# Patient Record
Sex: Male | Born: 1990 | Race: Black or African American | Hispanic: No | Smoking: Never smoker
Health system: Southern US, Community
[De-identification: ages and names within clinical notes are randomized; demographics above are authoritative.]

---
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
## 2010-03-30 LAB — POCT I-STAT, CHEM 8
Creatinine, Ser: 1.3 mg/dL (ref 0.4–1.5)
Glucose, Bld: 85 mg/dL (ref 70–99)
Hemoglobin: 15.3 g/dL (ref 13.0–17.0)

## 2010-04-03 LAB — POCT I-STAT, CHEM 8
BUN: 18 mg/dL (ref 6–23)
Calcium, Ion: 1.18 mmol/L (ref 1.12–1.32)
Chloride: 106 mEq/L (ref 96–112)
Creatinine, Ser: 1 mg/dL (ref 0.4–1.5)
HCT: 44 % (ref 39.0–52.0)
Hemoglobin: 15 g/dL (ref 13.0–17.0)
Potassium: 4.2 mEq/L (ref 3.5–5.1)
TCO2: 28 mmol/L (ref 0–100)

## 2010-05-09 ENCOUNTER — Other Ambulatory Visit: Payer: Self-pay | Admitting: Internal Medicine

## 2010-05-09 DIAGNOSIS — K219 Gastro-esophageal reflux disease without esophagitis: Secondary | ICD-10-CM

## 2010-05-09 DIAGNOSIS — R1013 Epigastric pain: Secondary | ICD-10-CM

## 2010-05-11 ENCOUNTER — Ambulatory Visit
Admission: RE | Admit: 2010-05-11 | Discharge: 2010-05-11 | Disposition: A | Discharge status: Home / Self Care | Payer: 59 | Source: Ambulatory Visit | Attending: Internal Medicine | Admitting: Internal Medicine

## 2010-05-11 DIAGNOSIS — R1013 Epigastric pain: Secondary | ICD-10-CM

## 2010-05-11 DIAGNOSIS — K219 Gastro-esophageal reflux disease without esophagitis: Secondary | ICD-10-CM

## 2010-05-13 ENCOUNTER — Emergency Department (HOSPITAL_COMMUNITY): Payer: 59

## 2010-05-13 ENCOUNTER — Emergency Department (HOSPITAL_COMMUNITY)
Admission: EM | Admit: 2010-05-13 | Discharge: 2010-05-13 | Disposition: A | Discharge status: Home / Self Care | Payer: 59 | Attending: Emergency Medicine | Admitting: Emergency Medicine

## 2010-05-13 DIAGNOSIS — R109 Unspecified abdominal pain: Secondary | ICD-10-CM | POA: Insufficient documentation

## 2010-05-13 DIAGNOSIS — K219 Gastro-esophageal reflux disease without esophagitis: Secondary | ICD-10-CM | POA: Insufficient documentation

## 2010-05-13 DIAGNOSIS — R509 Fever, unspecified: Secondary | ICD-10-CM | POA: Insufficient documentation

## 2010-05-13 DIAGNOSIS — J3489 Other specified disorders of nose and nasal sinuses: Secondary | ICD-10-CM | POA: Insufficient documentation

## 2010-05-13 LAB — COMPREHENSIVE METABOLIC PANEL
ALT: 9 U/L (ref 0–53)
AST: 16 U/L (ref 0–37)
Alkaline Phosphatase: 73 U/L (ref 39–117)
Calcium: 9.4 mg/dL (ref 8.4–10.5)
Chloride: 99 mEq/L (ref 96–112)
Creatinine, Ser: 1.05 mg/dL (ref 0.4–1.5)
GFR calc Af Amer: >60 mL/min (ref >60)
GFR calc non Af Amer: >60 mL/min (ref >60)
Glucose, Bld: 81 mg/dL (ref 70–99)
Total Protein: 8.2 g/dL (ref 6.0–8.3)

## 2010-05-13 LAB — CBC
MCH: 28.3 pg (ref 26.0–34.0)
MCV: 82.8 fL (ref 78.0–100.0)
Platelets: 210 10*3/uL (ref 150–400)
RBC: 5.34 MIL/uL (ref 4.22–5.81)
WBC: 8 10*3/uL (ref 4.0–10.5)

## 2010-05-13 LAB — DIFFERENTIAL
Basophils Relative: 1 % (ref 0–1)
Monocytes Relative: 12 % (ref 3–12)
Neutrophils Relative %: 63 % (ref 43–77)

## 2011-05-27 ENCOUNTER — Ambulatory Visit (INDEPENDENT_AMBULATORY_CARE_PROVIDER_SITE_OTHER): Payer: 59 | Admitting: Family Medicine

## 2011-05-27 ENCOUNTER — Encounter: Payer: Self-pay | Admitting: Family Medicine

## 2011-05-27 VITALS — BP 116/72 | HR 75 | Ht 69.0 in | Wt 128.0 lb

## 2011-05-27 DIAGNOSIS — R079 Chest pain, unspecified: Secondary | ICD-10-CM

## 2011-05-27 DIAGNOSIS — R209 Unspecified disturbances of skin sensation: Secondary | ICD-10-CM

## 2011-05-27 DIAGNOSIS — R200 Anesthesia of skin: Secondary | ICD-10-CM

## 2011-05-27 DIAGNOSIS — Z8719 Personal history of other diseases of the digestive system: Secondary | ICD-10-CM

## 2011-05-27 NOTE — Patient Instructions (Signed)
If you have these symptoms again, be aware of what, when, where and why. Turn here for further evaluation at that point.

## 2011-05-27 NOTE — Progress Notes (Signed)
  Subjective:    Patient ID: Bruce Murray, male    DOB: [birth year], 20 y.o.   MRN: [id number]  HPI He is here for consult concerning 2 issues. The first is left axillary discomfort that occurred with no physical activity in fact he went out to play basketball after this and had no difficulties. He cannot associate this with breathing, movement, arm trouble. He is also had another episode of left hand numbness. He noted one incident while he was driving but he was using his right arm. Both the symptoms went away relatively quickly and are not related to each other. He was seen in the emergency room in the past and told that he had reflux disease. He has been on Nexium for this. Review of Systems     Objective:   Physical Exam  cardiac exam shows regular rhythm without murmurs or gallops. Lungs clear to auscultation. No chest wall tenderness. Axillary exam negative. Full motion of the shoulder with no laxity or pain. Normal strength.       Assessment & Plan:   1. History of gastroesophageal reflux (GERD)   2. Chest pain at rest   3. Left arm numbness    instructed him to pay attention to his symptoms in regard to what, when, where and why. I reassured him that I found nothing of significance.

## 2012-04-29 IMAGING — RF DG UGI W/ HIGH DENSITY W/KUB
19 of 24 series · 19 of 24 positions shown · non-contrast
Comparison: [HOSPITAL] chest x-ray 09/13/2009.

CLINICAL DATA: Epigastric abdominal pain, gastroesophageal reflux
disease, sore throat.

UPPER GI SERIES WITH KUB
TECHNIQUE: Routine upper GI series was performed with thin and
high density barium with ingested 13 mm barium tablet with water.
Pediatric technique.
Fluoroscopy Time: 2.5 minutes

[Series 1: run · 1 of 1 slices shown (1 of 19)]
[im 1/1]
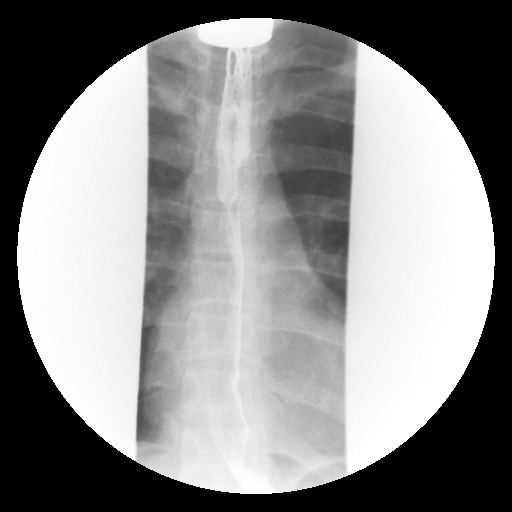

[Series 2: run · 1 of 1 slices shown (2 of 19)]
[im 1/1]
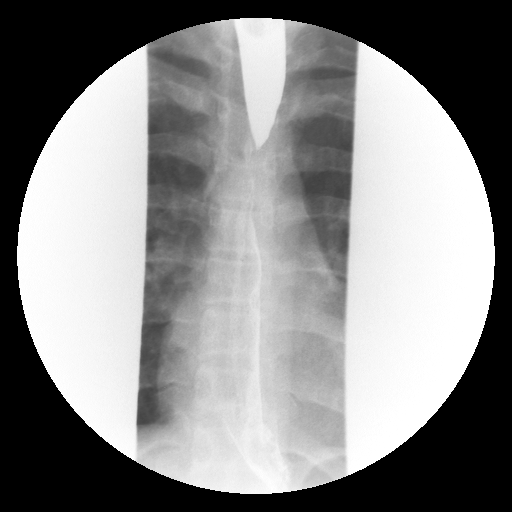

[Series 4: run · 1 of 1 slices shown (3 of 19)]
[im 1/1]
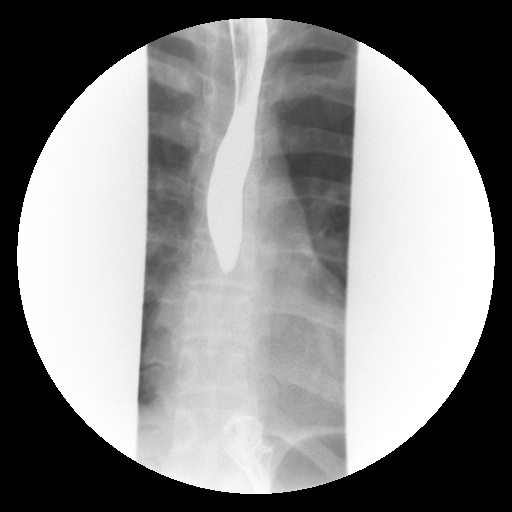

[Series 5: run · 1 of 1 slices shown (4 of 19)]
[im 1/1]
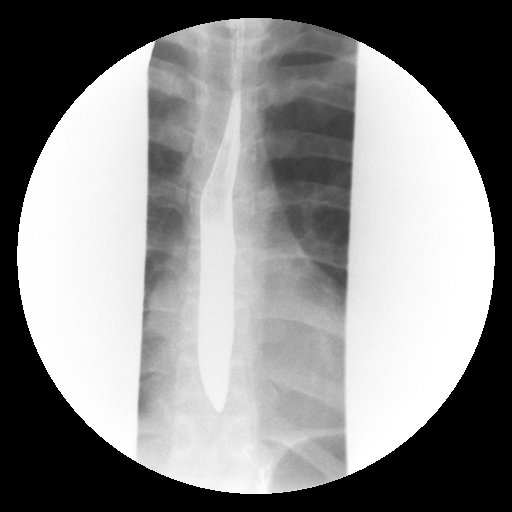

[Series 6: run · 1 of 1 slices shown (5 of 19)]
[im 1/1]
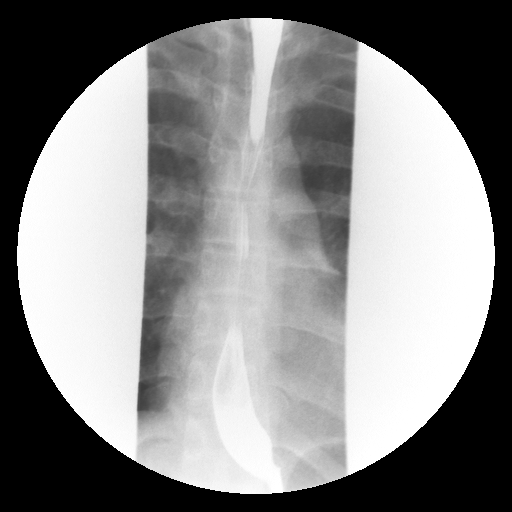

[Series 7: run · 1 of 1 slices shown (6 of 19)]
[im 1/1]
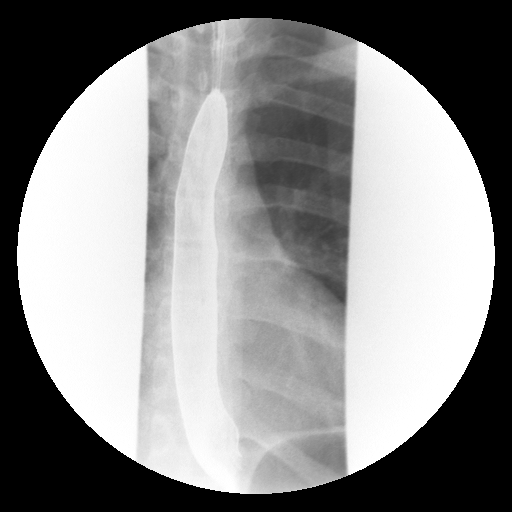

[Series 9: run · 1 of 1 slices shown (7 of 19)]
[im 1/1]
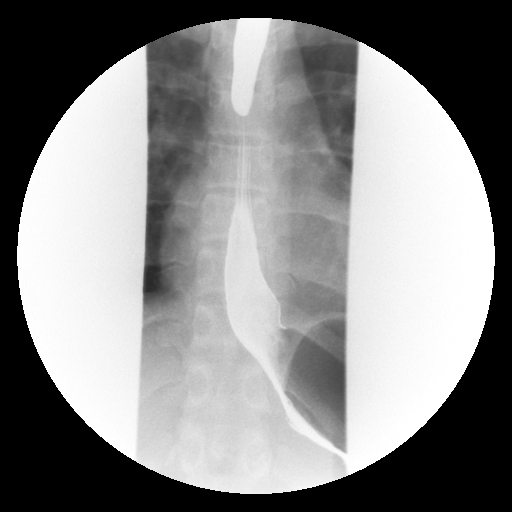

[Series 10: run · 1 of 1 slices shown (8 of 19)]
[im 1/1]
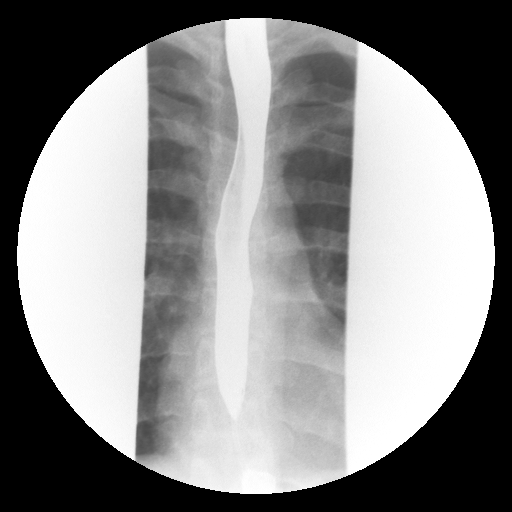

[Series 11: run · 1 of 1 slices shown (9 of 19)]
[im 1/1]
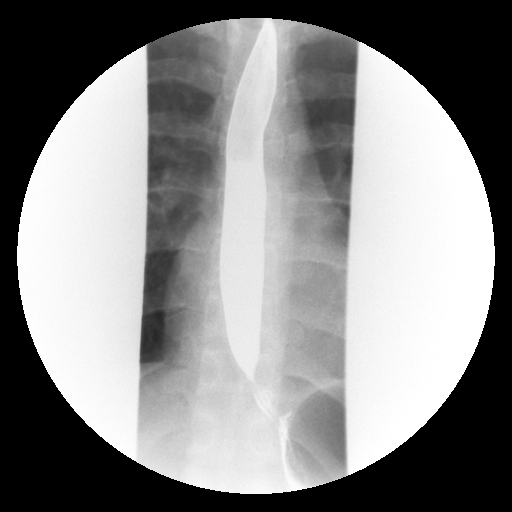

[Series 13: run · 1 of 1 slices shown (10 of 19)]
[im 1/1]
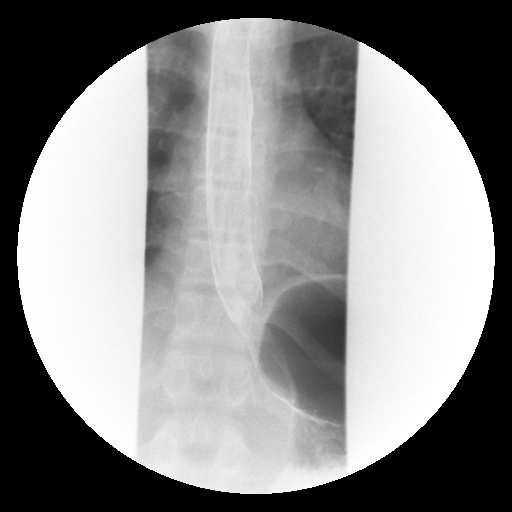

[Series 14: run · 1 of 5 slices shown (11 of 19)]
[im 1/5]
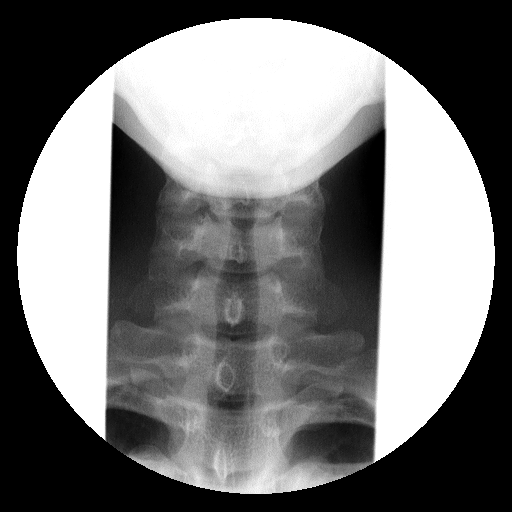

[Series 15: run · 1 of 1 slices shown (12 of 19)]
[im 1/1]
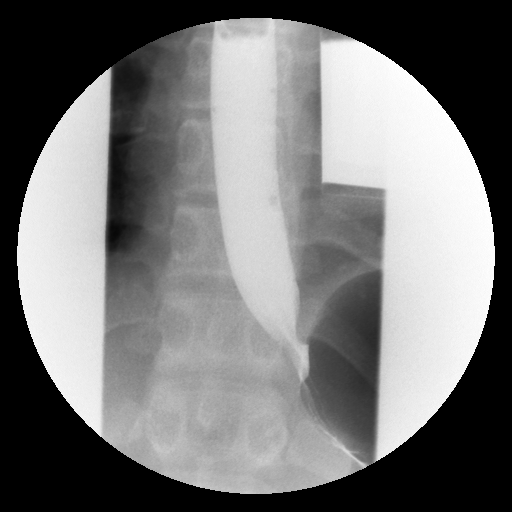

[Series 16: run · 1 of 1 slices shown (13 of 19)]
[im 1/1]
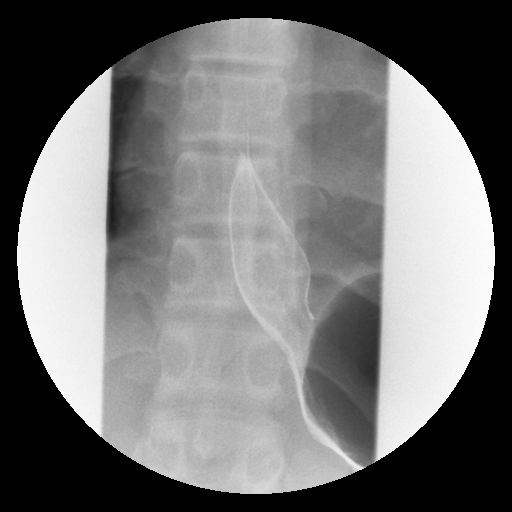

[Series 18: run · 1 of 1 slices shown (14 of 19)]
[im 1/1]
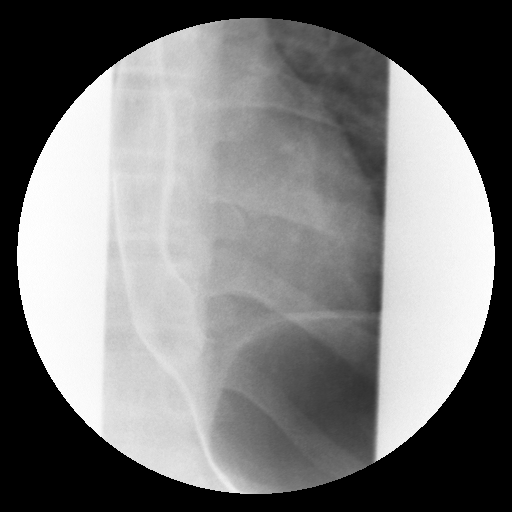

[Series 19: run · 1 of 11 slices shown (15 of 19)]
[im 1/11]
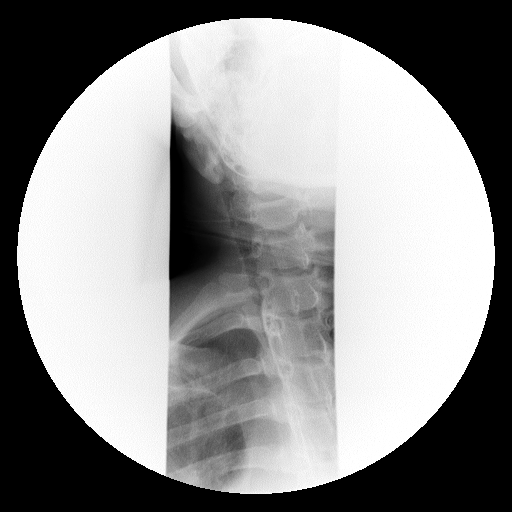

[Series 20: run · 1 of 17 slices shown (16 of 19)]
[im 1/17]
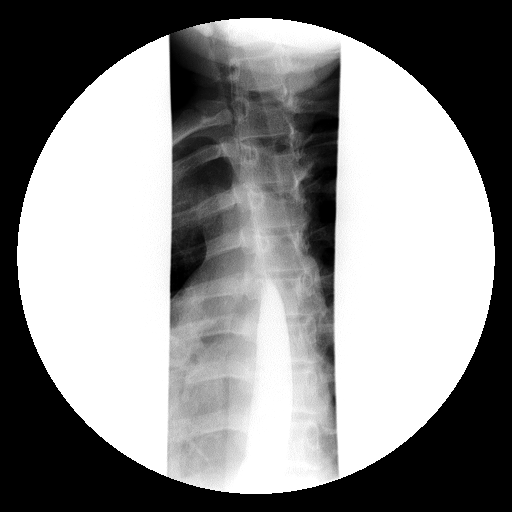

[Series 21: run · 1 of 3 slices shown (17 of 19)]
[im 1/3]
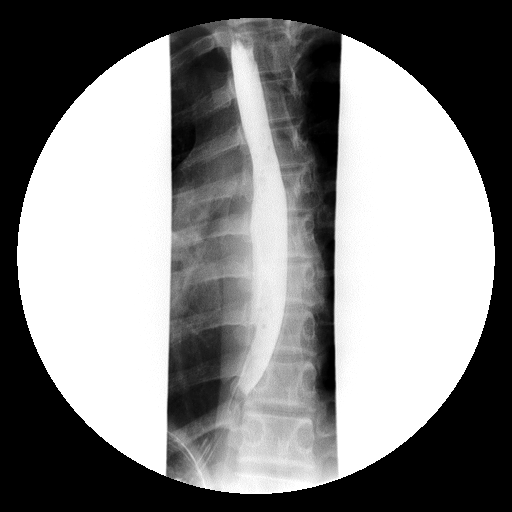

[Series 23: run · 1 of 1 slices shown (18 of 19)]
[im 1/1]
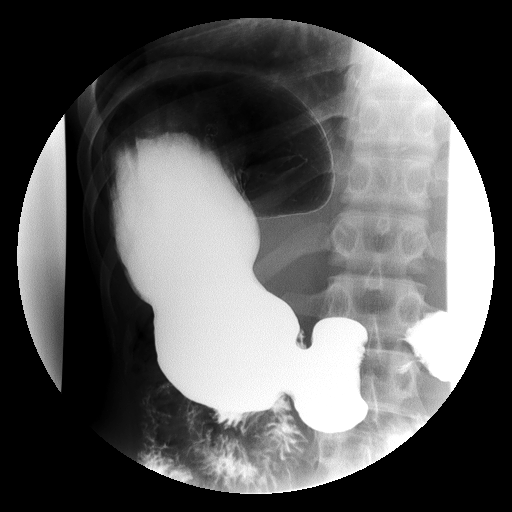

[Series 24: run · 1 of 1 slices shown (19 of 19)]
[im 1/1]
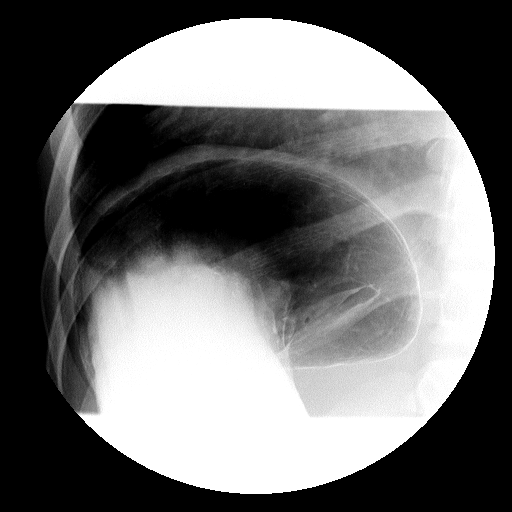

[19 of 24 positions shown; findings below may reference images not displayed]

FINDINGS: Normal antegrade peristalsis through cervical and
thoracic esophagus visualized.

Ingested 13 mm barium tablet readily passed into stomach.

No spontaneous with slight induced (Valsalva/water siphon)
gastroesophageal reflux demonstrated.

No other significant intrinsic or extrinsic esophageal lesions
visualized.

Stomach and jejunum appear normal.  Incidental tiny third portion
duodenal diverticulum visualized with duodenum otherwise normal-
appearing.  Prompt egress of barium through structures visualized.

Scout view demonstrates slight to moderate retained colonic feces
with normal bowel gas pattern visualized.
IMPRESSION: 1.  Slight induced gastroesophageal reflux.
2.  Incidental tiny third portion duodenal diverticulum.
3.  Otherwise, normal. (Slight to moderate retained colonic feces
with normal bowel gas pattern).

## 2012-05-01 IMAGING — CR DG ABDOMEN ACUTE W/ 1V CHEST
3 series · 3 of 3 positions shown · non-contrast
Comparison: Chest x-ray of 09/13/2009

CLINICAL DATA: Mid abdominal pain, constipation

ACUTE ABDOMEN SERIES (ABDOMEN 2 VIEW & CHEST 1 VIEW)

[w chest pa]
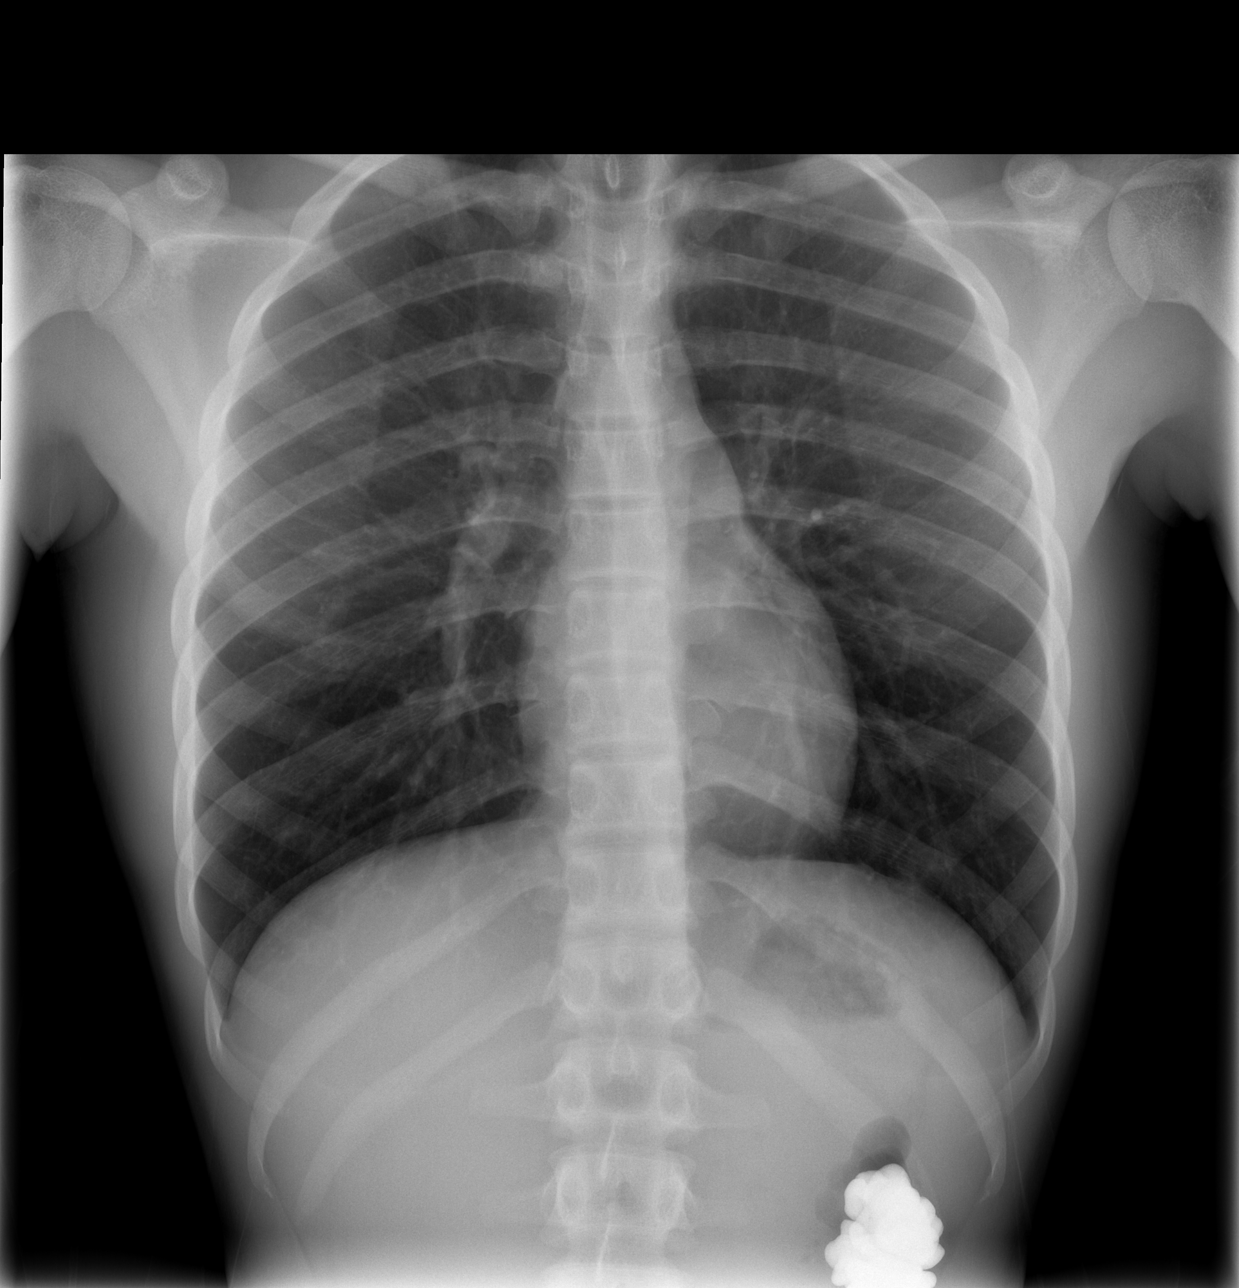

[w abdomen upright]
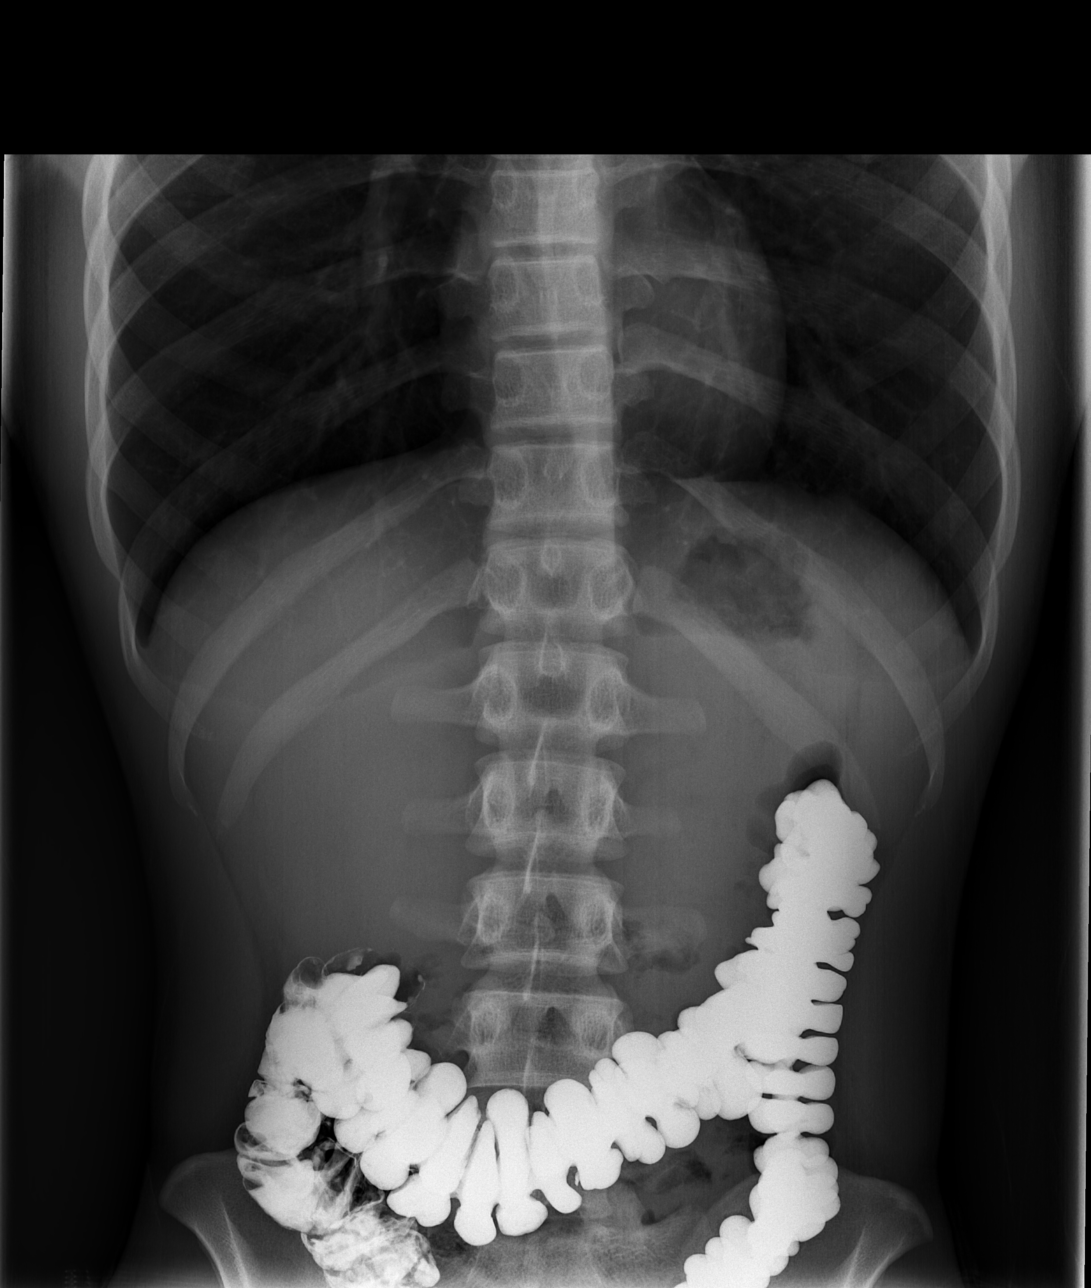

[t abdomen supine]
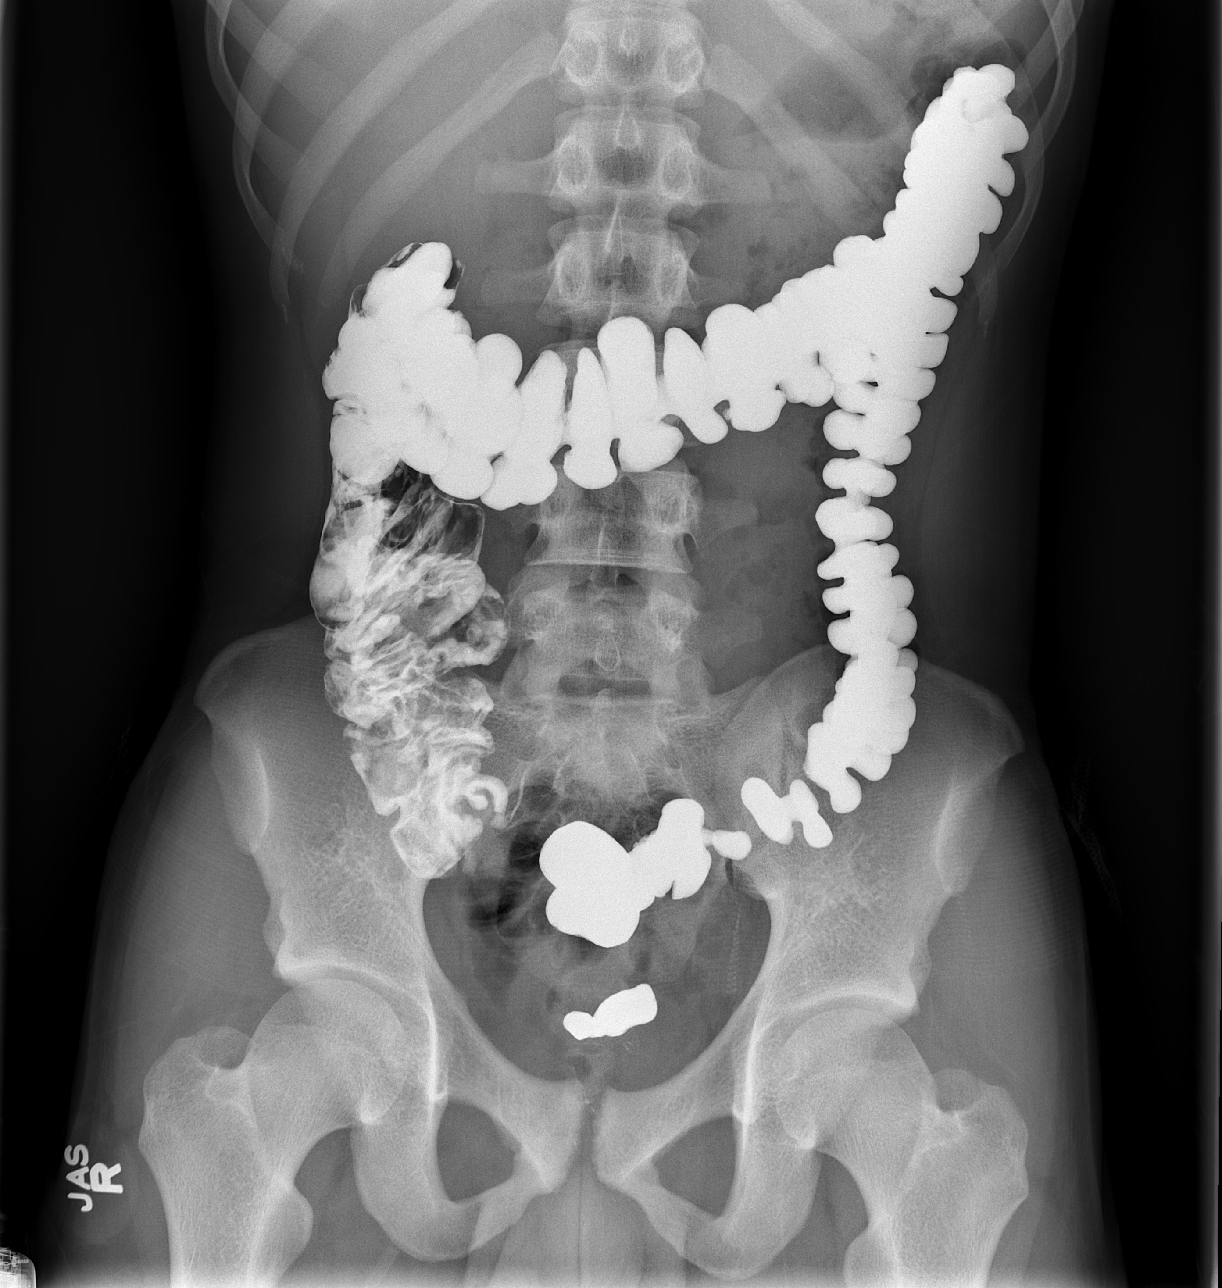

[3 of 3 positions shown; findings below may reference images not displayed]

FINDINGS: The lungs are clear.  Mediastinal contours appear normal.
The heart is within normal limits in size.

Supine and erect views of the abdomen show barium scattered
throughout the nondistended colon.  No small bowel distention is
seen.  No free air is noted.
IMPRESSION: 1.  No active lung disease.
2.  Barium throughout the nondistended colon.  No bowel
obstruction.

## 2018-04-17 ENCOUNTER — Encounter: Payer: Self-pay | Admitting: Emergency Medicine

## 2018-04-17 ENCOUNTER — Other Ambulatory Visit: Payer: Self-pay

## 2018-04-17 ENCOUNTER — Ambulatory Visit
Admission: EM | Admit: 2018-04-17 | Discharge: 2018-04-17 | Disposition: A | Discharge status: Home / Self Care | Payer: BLUE CROSS/BLUE SHIELD | Attending: Family Medicine | Admitting: Family Medicine

## 2018-04-17 DIAGNOSIS — Z23 Encounter for immunization: Secondary | ICD-10-CM | POA: Diagnosis not present

## 2018-04-17 DIAGNOSIS — W25XXXA Contact with sharp glass, initial encounter: Secondary | ICD-10-CM | POA: Diagnosis not present

## 2018-04-17 DIAGNOSIS — S91012A Laceration without foreign body, left ankle, initial encounter: Secondary | ICD-10-CM | POA: Diagnosis not present

## 2018-04-17 MED ORDER — TETANUS-DIPHTH-ACELL PERTUSSIS 5-2.5-18.5 LF-MCG/0.5 IM SUSP
0.5000 mL | Freq: Once | INTRAMUSCULAR | Status: AC
  Administered 2018-04-17: 14:00:00 0.5 mL via INTRAMUSCULAR

## 2018-04-17 NOTE — ED Notes (Signed)
Wound cleaned with soap and water, then rinsed with of sterile water.

## 2018-04-17 NOTE — Discharge Instructions (Signed)

## 2018-04-17 NOTE — ED Provider Notes (Signed)
[facility]    CSN: [id number] Arrival date & time: 04/17/18  1333     History   Chief Complaint Chief Complaint  Patient presents with  . Laceration    HPI Bruce Murray is a 28 y.o. male no significant medical history presenting today for evaluation of ankle laceration.  Patient states that this morning his mirror broke and the glass from this cut his left ankle.  States bleeding was controlled.  Tetanus was approximately 5 years ago.  Tried cleaning it out with peroxide.  Denies difficulty moving foot.  Denies numbness or tingling.  HPI  History reviewed. No pertinent past medical history.  There are no active problems to display for this patient.   History reviewed. No pertinent surgical history.     Home Medications    Prior to Admission medications   Medication Sig Start Date End Date Taking? Authorizing Provider  esomeprazole (NEXIUM) 40 MG capsule Take 40 mg by mouth daily before breakfast. Name brand only works    [provider]    Family History History reviewed. No pertinent family history.  Social History Social History   Tobacco Use  . Smoking status: Never Smoker  . Smokeless tobacco: Never Used  Substance Use Topics  . Alcohol use: Not Currently  . Drug use: Not Currently     Allergies   Patient has no known allergies.   Review of Systems Review of Systems  Constitutional: Negative for fatigue and fever.  Eyes: Negative for redness, itching and visual disturbance.  Respiratory: Negative for shortness of breath.   Cardiovascular: Negative for chest pain and leg swelling.  Gastrointestinal: Negative for nausea and vomiting.  Musculoskeletal: Negative for arthralgias and myalgias.  Skin: Positive for wound. Negative for color change and rash.  Neurological: Negative for dizziness, syncope, weakness, light-headedness and headaches.     Physical Exam Triage Vital Signs ED Triage Vitals [04/17/18 1342]  Enc Vitals  Group     BP (!) 119/58     Pulse Rate 61     Resp 18     Temp 97.9 F (36.6 C)     Temp Source Oral     SpO2 97 %     Weight      Height      Head Circumference      Peak Flow      Pain Score 0     Pain Loc      Pain Edu?      Excl. in GC?    No data found.  Updated Vital Signs BP (!) 119/58 (BP Location: Left Arm)   Pulse 61   Temp 97.9 F (36.6 C) (Oral)   Resp 18   SpO2 97%   Visual Acuity Right Eye Distance:   Left Eye Distance:   Bilateral Distance:    Right Eye Near:   Left Eye Near:    Bilateral Near:     Physical Exam Vitals signs and nursing note reviewed.  Constitutional:      Appearance: He is well-developed.     Comments: No acute distress  HENT:     Head: Normocephalic and atraumatic.     Nose: Nose normal.  Eyes:     Conjunctiva/sclera: Conjunctivae normal.  Neck:     Musculoskeletal: Neck supple.  Cardiovascular:     Rate and Rhythm: Normal rate.  Pulmonary:     Effort: Pulmonary effort is normal. No respiratory distress.  Abdominal:     General: There is  no distension.  Musculoskeletal: Normal range of motion.     Comments: Full active range of motion of foot, negative Thompson's Dorsalis pedis 2+, sensation intact distally  Skin:    General: Skin is warm and dry.     Comments: Left lateral ankle with approximately 1.5 cm laceration extending from Achilles towards lateral malleolus, well reapproximated with plantar flexion, but does gape approximately 3 to 4 mm with dorsiflexion  No visible underlying tendon   Neurological:     Mental Status: He is alert and oriented to person, place, and time.      UC Treatments / Results  Labs (all labs ordered are listed, but only abnormal results are displayed) Labs Reviewed - No data to display  EKG None  Radiology No results found.  Procedures Laceration Repair Date/Time: 04/17/2018 3:18 PM Performed by: , Junius Creamer, PA-C Authorized by: Eustace Moore, MD   Consent:     Consent obtained:  Verbal   Consent given by:  Patient   Risks discussed:  Infection, pain, tendon damage and poor cosmetic result   Alternatives discussed:  No treatment Anesthesia (see MAR for exact dosages):    Anesthesia method:  Local infiltration   Local anesthetic:  Lidocaine 2% w/o epi Laceration details:    Location:  Foot   Foot location:  L achilles   Length (cm):  1.5   Depth (mm):  3 Repair type:    Repair type:  Simple Pre-procedure details:    Preparation:  Patient was prepped and draped in usual sterile fashion Exploration:    Hemostasis achieved with:  Direct pressure   Wound exploration: wound explored through full range of motion     Wound extent: no foreign bodies/material noted, no muscle damage noted, no tendon damage noted and no underlying fracture noted     Contaminated: no   Treatment:    Area cleansed with:  Soap and water   Amount of cleaning:  Standard   Irrigation solution:  Sterile water   Irrigation volume:  200   Irrigation method:  Syringe   Visualized foreign bodies/material removed: no   Skin repair:    Repair method:  Sutures   Suture size:  5-0   Suture material:  Prolene   Suture technique:  Simple interrupted   Number of sutures:  3 Approximation:    Approximation:  Close Post-procedure details:    Dressing:  Non-adherent dressing   Patient tolerance of procedure:  Tolerated well, no immediate complications   (including critical care time)  Medications Ordered in UC Medications  Tdap (BOOSTRIX) injection 0.5 mL (0.5 mLs Intramuscular Given 04/17/18 1403)    Initial Impression / Assessment and Plan / UC Course  I have reviewed the triage vital signs and the nursing notes.  Pertinent labs & imaging results that were available during my care of the patient were reviewed by me and considered in my medical decision making (see chart for details).    Tetanus updated. Wound repaired given gaping with movement of foot, felt  sutures would hold better than Dermabond.  Does not appear to have any tendon involvement, negative Thompson.  Laceration repaired with 3 sutures, sutures to be removed in 7 to 10 days.  No immediate complications of repair.  Discussed wound care.  Continue to monitor,Discussed strict return precautions. Patient verbalized understanding and is agreeable with plan.  Final Clinical Impressions(s) / UC Diagnoses   Final diagnoses:  Laceration of left ankle, initial encounter  Discharge Instructions     WOUND CARE Please return in 7-10 days to have your stitches/staples removed or sooner if you have concerns. Marland Kitchen Keep area clean and dry for 24 hours. Do not remove bandage, if applied. . After 24 hours, remove bandage and wash wound gently with mild soap and warm water. Reapply a new bandage after cleaning wound, if directed. . Continue daily cleansing with soap and water until stitches/staples are removed. . Do not apply any ointments or creams to the wound while stitches/staples are in place, as this may cause delayed healing. . Notify the office if you experience any of the following signs of infection: Swelling, redness, pus drainage, streaking, fever >101.0 F . Notify the office if you experience excessive bleeding that does not stop after 15-20 minutes of constant, firm pressure.    ED Prescriptions    None     Controlled Substance Prescriptions [place] Controlled Substance Registry consulted? Not Applicable   Lew Dawes, [place] 04/17/18 1520

## 2018-04-17 NOTE — ED Notes (Signed)
Patient able to ambulate independently  

## 2018-04-17 NOTE — ED Triage Notes (Signed)
Pt cut external left ankle this morning with a broken mirror.  Pt states he cleaned it out with peroxide after it happened.  States tetanus update was greater than 5 years.  Bleeding controlled.

## 2018-04-24 ENCOUNTER — Other Ambulatory Visit: Payer: Self-pay

## 2018-04-24 ENCOUNTER — Ambulatory Visit
Admission: EM | Admit: 2018-04-24 | Discharge: 2018-04-24 | Disposition: A | Discharge status: Home / Self Care | Payer: BLUE CROSS/BLUE SHIELD

## 2018-04-24 DIAGNOSIS — Z4802 Encounter for removal of sutures: Secondary | ICD-10-CM | POA: Diagnosis not present

## 2018-04-24 NOTE — ED Notes (Signed)
Patient able to ambulate independently  

## 2018-04-24 NOTE — ED Triage Notes (Signed)
Pt presents for removal of sutures, 3 removed.

## 2019-09-04 ENCOUNTER — Ambulatory Visit
Admission: RE | Admit: 2019-09-04 | Discharge: 2019-09-04 | Disposition: A | Discharge status: Home / Self Care | Payer: 59 | Source: Ambulatory Visit | Attending: Physician Assistant | Admitting: Physician Assistant

## 2019-09-04 ENCOUNTER — Other Ambulatory Visit: Payer: Self-pay

## 2019-09-04 VITALS — BP 126/83 | HR 74 | Temp 98.4°F | Resp 16

## 2019-09-04 DIAGNOSIS — R1012 Left upper quadrant pain: Secondary | ICD-10-CM

## 2019-09-04 DIAGNOSIS — R079 Chest pain, unspecified: Secondary | ICD-10-CM

## 2019-09-04 MED ORDER — DICYCLOMINE HCL 20 MG PO TABS: 20.0000 mg | ORAL_TABLET | Freq: Two times a day (BID) | ORAL | 0 refills | Status: AC

## 2019-09-04 NOTE — ED Provider Notes (Signed)
[facility]    CSN: [id number] Arrival date & time: 09/04/19  1156      History   Chief Complaint Chief Complaint  Patient presents with  . Chest Pain    Appt  . Abdominal Pain    HPI Bruce Murray is a 29 y.o. male.   29 year old male comes in for 1 month history of left chest/abdominal pain. Was at first intermittent chronically, now consistent for 1 month. Aching pain, worse with food, increased emotions. Some relief of symptoms with water, calming down. Also notes intermittent diarrhea that has resolved in the past 1-2 weeks, which did relieve some of the left chest discomfort. Denies nausea, vomiting. Denies melena, hematochezia. Denies cough, shortness of breath, dizziness. No fevers. prilosec 14 days without significant relief.      History reviewed. No pertinent past medical history.  There are no problems to display for this patient.   Past Surgical History:  Procedure Laterality Date  . ANTERIOR CRUCIATE LIGAMENT REPAIR Left 2014       Home Medications    Prior to Admission medications   Medication Sig Start Date End Date Taking? Authorizing Provider  dicyclomine (BENTYL) 20 MG tablet Take 1 tablet (20 mg total) by mouth 2 (two) times daily. 09/04/19   Cathie Hoops, Jakaree Pickard V, PA-C  esomeprazole (NEXIUM) 40 MG capsule Take 40 mg by mouth daily before breakfast. Name brand only works    [provider]    Family History Family History  Problem Relation Age of Onset  . Healthy Mother   . Healthy Father     Social History Social History   Tobacco Use  . Smoking status: Never Smoker  . Smokeless tobacco: Never Used  Substance Use Topics  . Alcohol use: Not Currently  . Drug use: Not Currently     Allergies   Patient has no known allergies.   Review of Systems Review of Systems  Reason unable to perform ROS: See HPI as above.     Physical Exam Triage Vital Signs ED Triage Vitals  Enc Vitals Group     BP 09/04/19 1235 126/83      Pulse Rate 09/04/19 1235 74     Resp 09/04/19 1235 16     Temp 09/04/19 1235 98.4 F (36.9 C)     Temp Source 09/04/19 1235 Oral     SpO2 09/04/19 1235 98 %     Weight --      Height --      Head Circumference --      Peak Flow --      Pain Score 09/04/19 1304 4     Pain Loc --      Pain Edu? --      Excl. in GC? --    No data found.  Updated Vital Signs BP 126/83 (BP Location: Left Arm)   Pulse 74   Temp 98.4 F (36.9 C) (Oral)   Resp 16   SpO2 98%   Physical Exam Constitutional:      General: He is not in acute distress.    Appearance: Normal appearance. He is well-developed. He is not toxic-appearing or diaphoretic.  HENT:     Head: Normocephalic and atraumatic.  Eyes:     Conjunctiva/sclera: Conjunctivae normal.     Pupils: Pupils are equal, round, and reactive to light.  Cardiovascular:     Rate and Rhythm: Normal rate and regular rhythm.     Heart sounds: No murmur heard.  No friction rub. No gallop.   Pulmonary:     Effort: Pulmonary effort is normal. No respiratory distress.     Comments: LCTAB Chest:     Chest wall: No tenderness.  Abdominal:     General: Bowel sounds are increased.     Palpations: Abdomen is soft.     Tenderness: There is no abdominal tenderness. There is no guarding or rebound.  Musculoskeletal:     Cervical back: Normal range of motion and neck supple.  Skin:    General: Skin is warm and dry.  Neurological:     Mental Status: He is alert and oriented to person, place, and time.      UC Treatments / Results  Labs (all labs ordered are listed, but only abnormal results are displayed) Labs Reviewed - No data to display  EKG   Radiology No results found.  Procedures Procedures (including critical care time)  Medications Ordered in UC Medications - No data to display  Initial Impression / Assessment and Plan / UC Course  I have reviewed the triage vital signs and the nursing notes.  Pertinent labs & imaging results  that were available during my care of the patient were reviewed by me and considered in my medical decision making (see chart for details).    Chronic left chest/LUQ aching that seems more related with oral intake. No relief with PPI. Increased BS with frequent small stools at times, will trial bentyl. Otherwise, to follow up with PCP for further work up. Return precautions given.  Final Clinical Impressions(s) / UC Diagnoses   Final diagnoses:  Left-sided chest pain  LUQ abdominal pain    ED Prescriptions    Medication Sig Dispense Auth. Provider   dicyclomine (BENTYL) 20 MG tablet Take 1 tablet (20 mg total) by mouth 2 (two) times daily. 20 tablet Belinda Fisher, PA-C     PDMP not reviewed this encounter.   Belinda Fisher, PA-C 09/04/19 1409

## 2019-09-04 NOTE — Discharge Instructions (Signed)
No alarming signs on exam. We can try Bentyl for possible IBS to see if this helps symptoms. Keep hydrated, you urine should be clear to pale yellow in color. Monitor for any worsening of symptoms, nausea or vomiting not controlled by medication, worsening abdominal pain, fever, go to the emergency department for further evaluation needed. Otherwise follow up with PCP for further evaluation needed.

## 2019-09-04 NOTE — ED Triage Notes (Addendum)
Patient presents for evaluation of chest and abdominal pain that he states has been ongoing for over a month. He reports a history of reflux and taking prilosec OTC because he felt it was not helping. He denies shob, dizziness and no n/v/d.

## 2019-12-27 ENCOUNTER — Inpatient Hospital Stay (HOSPITAL_COMMUNITY): Admission: RE | Admit: 2019-12-27 | Payer: Self-pay | Source: Ambulatory Visit

## 2020-07-06 ENCOUNTER — Encounter: Payer: Self-pay | Admitting: Family Medicine

## 2020-09-07 ENCOUNTER — Other Ambulatory Visit: Payer: Self-pay

## 2020-09-07 ENCOUNTER — Ambulatory Visit (HOSPITAL_COMMUNITY)
Admission: EM | Admit: 2020-09-07 | Discharge: 2020-09-07 | Disposition: A | Discharge status: Home / Self Care | Payer: 59 | Attending: Internal Medicine | Admitting: Internal Medicine

## 2020-09-07 NOTE — ED Notes (Signed)
Bruce Murray, patient access, reports patient has left going to emergency department

## 2021-09-13 ENCOUNTER — Emergency Department (HOSPITAL_BASED_OUTPATIENT_CLINIC_OR_DEPARTMENT_OTHER): Payer: Commercial Managed Care - HMO

## 2021-09-13 ENCOUNTER — Other Ambulatory Visit: Payer: Self-pay

## 2021-09-13 ENCOUNTER — Emergency Department (HOSPITAL_BASED_OUTPATIENT_CLINIC_OR_DEPARTMENT_OTHER)
Admission: EM | Admit: 2021-09-13 | Discharge: 2021-09-13 | Disposition: A | Discharge status: Home / Self Care | Payer: Commercial Managed Care - HMO | Attending: Emergency Medicine | Admitting: Emergency Medicine

## 2021-09-13 ENCOUNTER — Encounter (HOSPITAL_BASED_OUTPATIENT_CLINIC_OR_DEPARTMENT_OTHER): Payer: Self-pay

## 2021-09-13 DIAGNOSIS — R079 Chest pain, unspecified: Secondary | ICD-10-CM | POA: Insufficient documentation

## 2021-09-13 NOTE — ED Provider Notes (Signed)
[facility] EMERGENCY DEPT Provider Note   CSN: [id number] Arrival date & time: 09/13/21  1202     History  Chief Complaint  Patient presents with   Chest Pain    Bruce Murray is a 31 y.o. male.  The history is provided by the patient.  Chest Pain Pain location:  L chest Pain quality: aching   Pain radiates to:  Does not radiate Pain severity:  Mild Onset quality:  Gradual Duration:  1 week Timing:  Intermittent Chronicity:  New Context: raising an arm   Relieved by:  Nothing Worsened by:  Nothing Associated symptoms: no abdominal pain, no AICD problem, no altered mental status, no anorexia, no anxiety, no back pain, no claudication, no cough, no diaphoresis, no dizziness, no dysphagia, no fatigue, no fever, no headache, no heartburn, no lower extremity edema, no nausea, no near-syncope, no numbness, no orthopnea, no palpitations, no PND, no shortness of breath, no syncope, no vomiting and no weakness   Risk factors: no coronary artery disease, no diabetes mellitus, no high cholesterol, no hypertension, no prior DVT/PE and no smoking        Home Medications Prior to Admission medications   Medication Sig Start Date End Date Taking? Authorizing Provider  dicyclomine (BENTYL) 20 MG tablet Take 1 tablet (20 mg total) by mouth 2 (two) times daily. 09/04/19   Cathie Hoops, Amy V, PA-C  esomeprazole (NEXIUM) 40 MG capsule Take 40 mg by mouth daily before breakfast. Name brand only works    [provider]      Allergies    Patient has no known allergies.    Review of Systems   Review of Systems  Constitutional:  Negative for diaphoresis, fatigue and fever.  HENT:  Negative for trouble swallowing.   Respiratory:  Negative for cough and shortness of breath.   Cardiovascular:  Positive for chest pain. Negative for palpitations, orthopnea, claudication, syncope, PND and near-syncope.  Gastrointestinal:  Negative for abdominal pain, anorexia, heartburn, nausea and  vomiting.  Musculoskeletal:  Negative for back pain.  Neurological:  Negative for dizziness, weakness, numbness and headaches.    Physical Exam Updated Vital Signs BP 120/83 (BP Location: Right Arm)   Pulse 66   Temp 98.6 F (37 C)   Resp 16   Ht 5\' 9"  (1.753 m)   Wt 58.1 kg   SpO2 100%   BMI 18.92 kg/m  Physical Exam Vitals and nursing note reviewed.  Constitutional:      General: He is not in acute distress.    Appearance: He is well-developed. He is not ill-appearing.  HENT:     Head: Normocephalic and atraumatic.  Eyes:     Extraocular Movements: Extraocular movements intact.     Conjunctiva/sclera: Conjunctivae normal.     Pupils: Pupils are equal, round, and reactive to light.  Cardiovascular:     Rate and Rhythm: Normal rate and regular rhythm.     Pulses:          Radial pulses are 2+ on the right side and 2+ on the left side.     Heart sounds: Normal heart sounds. No murmur heard. Pulmonary:     Effort: Pulmonary effort is normal. No respiratory distress.     Breath sounds: Normal breath sounds. No decreased breath sounds or wheezing.  Abdominal:     Palpations: Abdomen is soft.     Tenderness: There is no abdominal tenderness.  Musculoskeletal:        General: No swelling.  Cervical back: Normal range of motion and neck supple.  Skin:    General: Skin is warm and dry.     Capillary Refill: Capillary refill takes less than 2 seconds.  Neurological:     General: No focal deficit present.     Mental Status: He is alert.  Psychiatric:        Mood and Affect: Mood normal.     ED Results / Procedures / Treatments   Labs (all labs ordered are listed, but only abnormal results are displayed) Labs Reviewed - No data to display  EKG EKG Interpretation  Date/Time:  Thursday September 13 2021 12:09:39 EDT Ventricular Rate:  75 PR Interval:  160 QRS Duration: 94 QT Interval:  340 QTC Calculation: 379 R Axis:   94 Text Interpretation: Normal sinus  rhythm with sinus arrhythmia When compared with ECG of 21-Oct-2009 10:43, Vent. rate has increased BY  27 BPM ST no longer elevated in Anterior leads Nonspecific T wave abnormality now evident in Inferior leads Nonspecific T wave abnormality now evident in Lateral leads Confirmed by Virgina Norfolk ([id number]) on 09/13/2021 12:15:36 PM  Radiology DG Chest Portable 1 View  Result Date: 09/13/2021 CLINICAL DATA:  Shortness of breath chest pain EXAM: PORTABLE CHEST 1 VIEW COMPARISON:  01/31/2017 FINDINGS: The heart size and mediastinal contours are within normal limits. Both lungs are clear. The visualized skeletal structures are unremarkable. IMPRESSION: No active disease. Electronically Signed   By: Judie Petit.  Shick M.D.   On: 09/13/2021 12:30    Procedures Procedures    Medications Ordered in ED Medications - No data to display  ED Course/ Medical Decision Making/ A&P                           Medical Decision Making Amount and/or Complexity of Data Reviewed Radiology: ordered.   Bryndan Bilyk is here with left-sided chest pain.  Normal vitals.  No fever.  Heart score 0, PERC negative.  I have no concern for ACS or PE.  EKG per my review and interpretation shows sinus rhythm.  No ischemic changes.  Chest x-ray ordered per my review and interpretation shows no evidence of pneumonia pneumothorax.  Suspect that this is muscular in nature.  Overall recommend Tylenol and ibuprofen and rest.  Discharged in good condition.  Understands return precautions.  This chart was dictated using voice recognition software.  Despite best efforts to proofread,  errors can occur which can change the documentation meaning.         Final Clinical Impression(s) / ED Diagnoses Final diagnoses:  Chest pain, unspecified type    Rx / DC Orders ED Discharge Orders     None         Virgina Norfolk, DO 09/13/21 1311

## 2021-09-13 NOTE — ED Triage Notes (Signed)
Patient here POV from Home.  Endorses CP that began this Week and is associated with SOB. Pain is Left lateral Chest in Axillary Region.  History of GERD. No N/V/D. No Fevers.  NAD Noted during Triage. A&Ox4. GCS 15 Ambulatory.

## 2021-09-13 NOTE — Discharge Instructions (Signed)
Chest x-ray with no evidence of pneumonia or any acute process.  Overall suspect that this is a muscular process.  Recommend 1000 mg of Tylenol every 6 hours as needed for pain.  Recommend 800 mg ibuprofen every 8 hours as needed for pain.  Follow-up with your primary care doctor.

## 2021-09-19 ENCOUNTER — Encounter: Payer: Self-pay | Admitting: Internal Medicine

## 2021-10-23 ENCOUNTER — Encounter: Payer: Self-pay | Admitting: Internal Medicine

## 2021-11-05 ENCOUNTER — Encounter: Payer: Self-pay | Admitting: Internal Medicine

## 2022-04-01 ENCOUNTER — Encounter (HOSPITAL_BASED_OUTPATIENT_CLINIC_OR_DEPARTMENT_OTHER): Payer: Self-pay

## 2022-04-01 ENCOUNTER — Emergency Department (HOSPITAL_BASED_OUTPATIENT_CLINIC_OR_DEPARTMENT_OTHER)
Admission: EM | Admit: 2022-04-01 | Discharge: 2022-04-01 | Disposition: A | Discharge status: Home / Self Care | Payer: BLUE CROSS/BLUE SHIELD | Attending: Emergency Medicine | Admitting: Emergency Medicine

## 2022-04-01 ENCOUNTER — Other Ambulatory Visit: Payer: Self-pay

## 2022-04-01 ENCOUNTER — Emergency Department (HOSPITAL_BASED_OUTPATIENT_CLINIC_OR_DEPARTMENT_OTHER): Payer: BLUE CROSS/BLUE SHIELD | Admitting: Radiology

## 2022-04-01 DIAGNOSIS — M79602 Pain in left arm: Secondary | ICD-10-CM | POA: Diagnosis not present

## 2022-04-01 DIAGNOSIS — M542 Cervicalgia: Secondary | ICD-10-CM | POA: Diagnosis not present

## 2022-04-01 DIAGNOSIS — M79601 Pain in right arm: Secondary | ICD-10-CM | POA: Insufficient documentation

## 2022-04-01 DIAGNOSIS — R079 Chest pain, unspecified: Secondary | ICD-10-CM | POA: Diagnosis not present

## 2022-04-01 LAB — TROPONIN I (HIGH SENSITIVITY): Troponin I (High Sensitivity): <2 ng/L (ref <18)

## 2022-04-01 LAB — BASIC METABOLIC PANEL
Anion gap: 7 (ref 5–15)
BUN: 16 mg/dL (ref 6–20)
CO2: 29 mmol/L (ref 22–32)
Calcium: 9.9 mg/dL (ref 8.9–10.3)
Chloride: 100 mmol/L (ref 98–111)
Creatinine, Ser: 1.09 mg/dL (ref 0.61–1.24)
GFR, Estimated: >60 mL/min (ref >60)
Glucose, Bld: 90 mg/dL (ref 70–99)
Potassium: 4.2 mmol/L (ref 3.5–5.1)
Sodium: 136 mmol/L (ref 135–145)

## 2022-04-01 LAB — CBC
HCT: 47.7 % (ref 39.0–52.0)
Hemoglobin: 15.9 g/dL (ref 13.0–17.0)
MCH: 28.2 pg (ref 26.0–34.0)
MCHC: 33.3 g/dL (ref 30.0–36.0)
MCV: 84.6 fL (ref 80.0–100.0)
Platelets: 243 10*3/uL (ref 150–400)
RBC: 5.64 MIL/uL (ref 4.22–5.81)
RDW: 13.2 % (ref 11.5–15.5)
WBC: 6.1 10*3/uL (ref 4.0–10.5)
nRBC: 0 % (ref 0.0–0.2)

## 2022-04-01 NOTE — ED Notes (Signed)
No repeat troponin needed at this time, Per Dr. Melina Copa

## 2022-04-01 NOTE — ED Triage Notes (Signed)
Patient here POV from Home.  Endorses Left Lateral Rib Cage pain that began 2 nights ago with some Left Arm Numbness/Tingling.   No Known Trauma or Injury. No SOB at rest. Some SOB with Exertion.   NAD noted during Triage. A&Ox4, GCS 15. Ambulatory.

## 2022-04-01 NOTE — Discharge Instructions (Signed)
You are seen in the emergency department for arm chest neck pain.  You had blood work chest x-ray EKG that did not show any significant abnormalities.  This is likely muscular and you should continue ibuprofen 3 times a day increase your fluid intake.  Follow-up with your primary care doctor.  Return to the emergency department if any worsening or concerning symptoms.

## 2022-04-01 NOTE — ED Notes (Signed)
Patient given discharge instructions. Questions were answered. Patient verbalized understanding of discharge instructions and care at home.  

## 2022-04-01 NOTE — ED Provider Notes (Signed)
[facility] Provider Note   CSN: [id number] Arrival date & time: 04/01/22  1223     History {Add pertinent medical, surgical, social history, OB history to HPI:1} Chief Complaint  Patient presents with   Arm Pain    Bruce Murray is a 32 y.o. male.  He has no significant past medical history.  He is complaining of some discomfort in his upper arms neck and left lateral chest that is been going on for a few days.  Bruce Murray he worked out last Wednesday and feels a little sore afterwards but it is never lasted this long.  Feels a little short of breath at times with heavy exertion.  Thinks he was feverish.  No cough no abdominal pain vomiting diarrhea  The history is provided by the patient.  Arm Pain This is a new problem. The current episode started more than 2 days ago. The problem occurs constantly. The problem has not changed since onset.Associated symptoms include chest pain. Pertinent negatives include no abdominal pain, no headaches and no shortness of breath. The symptoms are aggravated by bending and twisting. Nothing relieves the symptoms. He has tried rest for the symptoms. The treatment provided no relief.       Home Medications Prior to Admission medications   Medication Sig Start Date End Date Taking? Authorizing Provider  dicyclomine (BENTYL) 20 MG tablet Take 1 tablet (20 mg total) by mouth 2 (two) times daily. 09/04/19   Tasia Catchings, Amy V, PA-C  esomeprazole (NEXIUM) 40 MG capsule Take 40 mg by mouth daily before breakfast. Name brand only works    [provider]      Allergies    Patient has no known allergies.    Review of Systems   Review of Systems  Constitutional:  Positive for fever.  Eyes:  Negative for visual disturbance.  Respiratory:  Negative for shortness of breath.   Cardiovascular:  Positive for chest pain.  Gastrointestinal:  Negative for abdominal pain.  Musculoskeletal:  Positive for neck pain.  Skin:   Negative for rash.  Neurological:  Negative for weakness, numbness and headaches.    Physical Exam Updated Vital Signs BP 127/77 (BP Location: Right Arm)   Pulse 66   Temp (!) 97.1 F (36.2 C) (Temporal)   Resp 18   Ht 5\' 9"  (1.753 m)   Wt 68.5 kg   SpO2 100%   BMI 22.30 kg/m  Physical Exam Vitals and nursing note reviewed.  Constitutional:      General: He is not in acute distress.    Appearance: Normal appearance. He is well-developed.  HENT:     Head: Normocephalic and atraumatic.  Eyes:     Conjunctiva/sclera: Conjunctivae normal.  Cardiovascular:     Rate and Rhythm: Normal rate and regular rhythm.     Heart sounds: No murmur heard. Pulmonary:     Effort: Pulmonary effort is normal. No respiratory distress.     Breath sounds: Normal breath sounds.  Abdominal:     Palpations: Abdomen is soft.     Tenderness: There is no abdominal tenderness.  Musculoskeletal:        General: No tenderness, deformity or signs of injury. Normal range of motion.     Cervical back: Neck supple.  Skin:    General: Skin is warm and dry.     Capillary Refill: Capillary refill takes less than 2 seconds.  Neurological:     General: No focal deficit present.  Mental Status: He is alert.     Sensory: No sensory deficit.     Motor: No weakness.     ED Results / Procedures / Treatments   Labs (all labs ordered are listed, but only abnormal results are displayed) Labs Reviewed  BASIC METABOLIC PANEL  CBC  TROPONIN I (HIGH SENSITIVITY)  TROPONIN I (HIGH SENSITIVITY)    EKG EKG Interpretation  Date/Time:  Monday April 01 2022 12:37:37 EDT Ventricular Rate:  85 PR Interval:  154 QRS Duration: 88 QT Interval:  350 QTC Calculation: 416 R Axis:   96 Text Interpretation: Normal sinus rhythm with sinus arrhythmia Rightward axis Nonspecific T wave abnormality Abnormal ECG When compared with ECG of 13-Sep-2021 12:09, No significant change was found Confirmed by Aletta Edouard  [phone]) on 04/01/2022 3:09:52 PM  Radiology DG Chest 2 View  Result Date: 04/01/2022 CLINICAL DATA:  CP EXAM: CHEST - 2 VIEW COMPARISON:  09/13/2021 FINDINGS: Cardiac silhouette is unremarkable. No pneumothorax or pleural effusion. The lungs are clear. The visualized skeletal structures are unremarkable. IMPRESSION: No acute cardiopulmonary process. Electronically Signed   By: Sammie Bench M.D.   On: 04/01/2022 13:07    Procedures Procedures  {Document cardiac monitor, telemetry assessment procedure when appropriate:1}  Medications Ordered in ED Medications - No data to display  ED Course/ Medical Decision Making/ A&P   {   Click here for ABCD2, HEART and other calculatorsREFRESH Note before signing :1}                          Medical Decision Making Amount and/or Complexity of Data Reviewed Labs: ordered. Radiology: ordered.   This patient complains of ***; this involves an extensive number of treatment Options and is a complaint that carries with it a high risk of complications and morbidity. The differential includes ***  I ordered, reviewed and interpreted labs, which included *** I ordered medication *** and reviewed PMP when indicated. I ordered imaging studies which included *** and I independently    visualized and interpreted imaging which showed *** Additional history obtained from *** Previous records obtained and reviewed *** I consulted *** and discussed lab and imaging findings and discussed disposition.  Cardiac monitoring reviewed, *** Social determinants considered, *** Critical Interventions: ***  After the interventions stated above, I reevaluated the patient and found *** Admission and further testing considered, ***   {Document critical care time when appropriate:1} {Document review of labs and clinical decision tools ie heart score, Chads2Vasc2 etc:1}  {Document your independent review of radiology images, and any outside records:1} {Document  your discussion with family members, caretakers, and with consultants:1} {Document social determinants of health affecting pt's care:1} {Document your decision making why or why not admission, treatments were needed:1} Final Clinical Impression(s) / ED Diagnoses Final diagnoses:  None    Rx / DC Orders ED Discharge Orders     None

## 2022-04-15 ENCOUNTER — Emergency Department (HOSPITAL_COMMUNITY): Payer: BLUE CROSS/BLUE SHIELD

## 2022-04-15 ENCOUNTER — Encounter (HOSPITAL_COMMUNITY): Payer: Self-pay

## 2022-04-15 ENCOUNTER — Emergency Department (HOSPITAL_COMMUNITY)
Admission: EM | Admit: 2022-04-15 | Discharge: 2022-04-15 | Disposition: A | Discharge status: Home / Self Care | Payer: BLUE CROSS/BLUE SHIELD | Attending: Emergency Medicine | Admitting: Emergency Medicine

## 2022-04-15 DIAGNOSIS — M25511 Pain in right shoulder: Secondary | ICD-10-CM | POA: Diagnosis present

## 2022-04-15 DIAGNOSIS — X58XXXA Exposure to other specified factors, initial encounter: Secondary | ICD-10-CM | POA: Diagnosis not present

## 2022-04-15 DIAGNOSIS — S46811A Strain of other muscles, fascia and tendons at shoulder and upper arm level, right arm, initial encounter: Secondary | ICD-10-CM

## 2022-04-15 DIAGNOSIS — S46911A Strain of unspecified muscle, fascia and tendon at shoulder and upper arm level, right arm, initial encounter: Secondary | ICD-10-CM | POA: Insufficient documentation

## 2022-04-15 LAB — CBC
HCT: 49.9 % (ref 39.0–52.0)
Hemoglobin: 16.1 g/dL (ref 13.0–17.0)
MCH: 27.8 pg (ref 26.0–34.0)
MCHC: 32.3 g/dL (ref 30.0–36.0)
MCV: 86.2 fL (ref 80.0–100.0)
Platelets: 246 10*3/uL (ref 150–400)
RBC: 5.79 MIL/uL (ref 4.22–5.81)
RDW: 13.1 % (ref 11.5–15.5)
WBC: 6.8 10*3/uL (ref 4.0–10.5)
nRBC: 0 % (ref 0.0–0.2)

## 2022-04-15 LAB — BASIC METABOLIC PANEL
Anion gap: 6 (ref 5–15)
BUN: 13 mg/dL (ref 6–20)
CO2: 28 mmol/L (ref 22–32)
Calcium: 9.4 mg/dL (ref 8.9–10.3)
Chloride: 101 mmol/L (ref 98–111)
Creatinine, Ser: 1 mg/dL (ref 0.61–1.24)
GFR, Estimated: >60 mL/min (ref >60)
Glucose, Bld: 94 mg/dL (ref 70–99)
Potassium: 4.1 mmol/L (ref 3.5–5.1)
Sodium: 135 mmol/L (ref 135–145)

## 2022-04-15 LAB — TROPONIN I (HIGH SENSITIVITY)
Troponin I (High Sensitivity): 2 ng/L (ref <18)
Troponin I (High Sensitivity): 2 ng/L (ref <18)

## 2022-04-15 LAB — D-DIMER, QUANTITATIVE: D-Dimer, Quant: 0.28 ug/mL-FEU (ref 0.00–0.50)

## 2022-04-15 MED ORDER — NAPROXEN 500 MG PO TABS: ORAL_TABLET | ORAL | 1 refills | Status: AC

## 2022-04-15 NOTE — ED Triage Notes (Signed)
Pt states pain started radiating from chest to shoulder. Pt is requesting MRI due to his pain- states feels like a chest/back workout. Pt states pain is increased with breathing. Pt states back right side is "sensitive" . Pt seen for same at [facility] ~2 weeks ago, inconclusive testing.   Denies cough, SHOB, fever, N/V/D

## 2022-04-15 NOTE — ED Provider Notes (Signed)
[place] EMERGENCY DEPARTMENT AT [facility] Provider Note   CSN: [id number] Arrival date & time: 04/15/22  1034     History {Add pertinent medical, surgical, social history, OB history to HPI:1} Chief Complaint  Patient presents with   Shoulder Pain   Chest Pain    Bruce Murray is a 32 y.o. male.  Patient complains of upper back pain and chest pain   Shoulder Pain Chest Pain      Home Medications Prior to Admission medications   Medication Sig Start Date End Date Taking? Authorizing Provider  naproxen (NAPROSYN) 500 MG tablet Take 1 pill twice a day if needed for upper back pain 04/15/22  Yes Milton Ferguson, MD  dicyclomine (BENTYL) 20 MG tablet Take 1 tablet (20 mg total) by mouth 2 (two) times daily. 09/04/19   Tasia Catchings, Amy V, PA-C  esomeprazole (NEXIUM) 40 MG capsule Take 40 mg by mouth daily before breakfast. Name brand only works    [provider]      Allergies    Patient has no known allergies.    Review of Systems   Review of Systems  Cardiovascular:  Positive for chest pain.    Physical Exam Updated Vital Signs BP 136/84 (BP Location: Right Arm)   Pulse 70   Temp 98.1 F (36.7 C) (Oral)   Resp 10   Ht 5\' 9"  (1.753 m)   Wt 68 kg   SpO2 100%   BMI 22.15 kg/m  Physical Exam  ED Results / Procedures / Treatments   Labs (all labs ordered are listed, but only abnormal results are displayed) Labs Reviewed  BASIC METABOLIC PANEL  CBC  D-DIMER, QUANTITATIVE  TROPONIN I (HIGH SENSITIVITY)  TROPONIN I (HIGH SENSITIVITY)    EKG EKG Interpretation  Date/Time:  Monday April 15 2022 10:42:34 EDT Ventricular Rate:  76 PR Interval:  151 QRS Duration: 88 QT Interval:  353 QTC Calculation: 397 R Axis:   91 Text Interpretation: Sinus rhythm Borderline right axis deviation Borderline T wave abnormalities ST elev, probable normal early repol pattern Confirmed by Milton Ferguson [phone]) on 04/15/2022 12:25:00 PM  Radiology DG Chest 2  View  Result Date: 04/15/2022 CLINICAL DATA:  Chest pain EXAM: CHEST - 2 VIEW COMPARISON:  04/01/2022 x-ray and older FINDINGS: No consolidation, pneumothorax or effusion. No edema. Normal cardiopericardial silhouette. Overlapping cardiac leads. IMPRESSION: No acute cardiopulmonary disease Electronically Signed   By: Jill Side M.D.   On: 04/15/2022 11:31    Procedures Procedures  {Document cardiac monitor, telemetry assessment procedure when appropriate:1}  Medications Ordered in ED Medications - No data to display  ED Course/ Medical Decision Making/ A&P   {   Click here for ABCD2, HEART and other calculatorsREFRESH Note before signing :1}                          Medical Decision Making Amount and/or Complexity of Data Reviewed Labs: ordered. Radiology: ordered.  Risk Prescription drug management.   Patient with trapezius muscle strain.  He is follow-up with either Ortho or primary care doctor and is given some Naprosyn  {Document critical care time when appropriate:1} {Document review of labs and clinical decision tools ie heart score, Chads2Vasc2 etc:1}  {Document your independent review of radiology images, and any outside records:1} {Document your discussion with family members, caretakers, and with consultants:1} {Document social determinants of health affecting pt's care:1} {Document your decision making why or why not admission, treatments  were needed:1} Final Clinical Impression(s) / ED Diagnoses Final diagnoses:  Strain of right trapezius muscle, initial encounter    Rx / DC Orders ED Discharge Orders          Ordered    naproxen (NAPROSYN) 500 MG tablet        04/15/22 1437

## 2022-04-15 NOTE — ED Notes (Signed)
Pt verbalized understanding of discharge instructions. Opportunity for questions provided.  

## 2022-04-15 NOTE — Discharge Instructions (Signed)
Follow-up with your family doctor or you can follow-up with an orthopedic doctor.  You have been referred to the orthopedic doctor [facility]

## 2023-11-23 ENCOUNTER — Other Ambulatory Visit: Payer: Self-pay

## 2023-11-23 ENCOUNTER — Emergency Department (HOSPITAL_BASED_OUTPATIENT_CLINIC_OR_DEPARTMENT_OTHER): Admitting: Radiology

## 2023-11-23 ENCOUNTER — Emergency Department (HOSPITAL_BASED_OUTPATIENT_CLINIC_OR_DEPARTMENT_OTHER)
Admission: EM | Admit: 2023-11-23 | Discharge: 2023-11-23 | Disposition: A | Discharge status: Home / Self Care | Attending: Emergency Medicine | Admitting: Emergency Medicine

## 2023-11-23 ENCOUNTER — Encounter (HOSPITAL_BASED_OUTPATIENT_CLINIC_OR_DEPARTMENT_OTHER): Payer: Self-pay

## 2023-11-23 DIAGNOSIS — R079 Chest pain, unspecified: Secondary | ICD-10-CM | POA: Diagnosis present

## 2023-11-23 DIAGNOSIS — R091 Pleurisy: Secondary | ICD-10-CM | POA: Insufficient documentation

## 2023-11-23 LAB — CBC WITH DIFFERENTIAL/PLATELET
Abs Immature Granulocytes: 0.02 K/uL (ref 0.00–0.07)
Basophils Absolute: 0 K/uL (ref 0.0–0.1)
Basophils Relative: 0 %
Eosinophils Absolute: 0.1 K/uL (ref 0.0–0.5)
Eosinophils Relative: 2 %
HCT: 43.4 % (ref 39.0–52.0)
Hemoglobin: 14.1 g/dL (ref 13.0–17.0)
Immature Granulocytes: 0 %
Lymphocytes Relative: 40 %
Lymphs Abs: 2.9 K/uL (ref 0.7–4.0)
MCH: 28 pg (ref 26.0–34.0)
MCHC: 32.5 g/dL (ref 30.0–36.0)
MCV: 86.3 fL (ref 80.0–100.0)
Monocytes Absolute: 0.6 K/uL (ref 0.1–1.0)
Monocytes Relative: 9 %
Neutro Abs: 3.7 K/uL (ref 1.7–7.7)
Neutrophils Relative %: 49 %
Platelets: 291 K/uL (ref 150–400)
RBC: 5.03 MIL/uL (ref 4.22–5.81)
RDW: 14 % (ref 11.5–15.5)
WBC: 7.5 K/uL (ref 4.0–10.5)
nRBC: 0 % (ref 0.0–0.2)

## 2023-11-23 LAB — COMPREHENSIVE METABOLIC PANEL WITH GFR
ALT: 29 U/L (ref 0–44)
AST: 20 U/L (ref 15–41)
Albumin: 4.5 g/dL (ref 3.5–5.0)
Alkaline Phosphatase: 69 U/L (ref 38–126)
Anion gap: 10 (ref 5–15)
BUN: 13 mg/dL (ref 6–20)
CO2: 26 mmol/L (ref 22–32)
Calcium: 9.8 mg/dL (ref 8.9–10.3)
Chloride: 104 mmol/L (ref 98–111)
Creatinine, Ser: 0.96 mg/dL (ref 0.61–1.24)
GFR, Estimated: >60 mL/min (ref >60)
Glucose, Bld: 103 mg/dL — ABNORMAL HIGH (ref 70–99)
Potassium: 4.2 mmol/L (ref 3.5–5.1)
Sodium: 139 mmol/L (ref 135–145)
Total Bilirubin: 0.5 mg/dL (ref 0.0–1.2)
Total Protein: 8.3 g/dL — ABNORMAL HIGH (ref 6.5–8.1)

## 2023-11-23 LAB — TROPONIN T, HIGH SENSITIVITY: Troponin T High Sensitivity: <15 ng/L (ref 0–19)

## 2023-11-23 LAB — LIPASE, BLOOD: Lipase: 15 U/L (ref 11–51)

## 2023-11-23 LAB — D-DIMER, QUANTITATIVE: D-Dimer, Quant: 0.34 ug{FEU}/mL (ref 0.00–0.50)

## 2023-11-23 MED ORDER — HYDROCODONE-ACETAMINOPHEN 5-325 MG PO TABS: 1.0000 | ORAL_TABLET | ORAL | 0 refills | Status: AC | PRN

## 2023-11-23 MED ORDER — DEXAMETHASONE SOD PHOSPHATE PF 10 MG/ML IJ SOLN
10.0000 mg | Freq: Once | INTRAMUSCULAR | Status: AC
  Administered 2023-11-23: 10 mg via INTRAVENOUS

## 2023-11-23 MED ORDER — METHYLPREDNISOLONE 4 MG PO TBPK: ORAL_TABLET | Freq: Every day | ORAL | 0 refills | Status: AC

## 2023-11-23 MED ORDER — KETOROLAC TROMETHAMINE 30 MG/ML IJ SOLN
30.0000 mg | Freq: Once | INTRAMUSCULAR | Status: AC
  Administered 2023-11-23: 30 mg via INTRAVENOUS
  Filled 2023-11-23: qty 1

## 2023-11-23 NOTE — ED Triage Notes (Signed)
 Pt reports R sided rib/side pain x2 days. Pt denies any respiratory issues. Pt reports increased pain on inspiration. Pt denies any recent overexertion.

## 2023-11-23 NOTE — ED Provider Notes (Signed)
 [place] EMERGENCY DEPARTMENT AT [facility] Provider Note   CSN: [id number] Arrival date & time: 11/23/23  9052     Patient presents with: Pleurisy   Bruce Murray is a 33 y.o. male.   Pt is a 33 yo male with pmhx significant for GERD.  Pt developed right sided cp (directly under his nipple) on Friday (11/7).  It started after eating.  He said it hurts when he takes a deep breath.  Eating does not bother it now.  Pain is not going away.  No f/c.  No uri sx.  No abd pain.  No n/v.  No leg swelling.  No rash.       Prior to Admission medications   Medication Sig Start Date End Date Taking? Authorizing Provider  HYDROcodone-acetaminophen (NORCO/VICODIN) 5-325 MG tablet Take 1 tablet by mouth every 4 (four) hours as needed. 11/23/23  Yes Taevin Mcferran, MD  methylPREDNISolone (MEDROL DOSEPAK) 4 MG TBPK tablet Take by mouth daily. Day 1:  2 pills at breakfast, 1 pill at lunch, 1 pill after supper, 2 pills at bedtime;Day 2:  1 pill at breakfast, 1 pill at lunch, 1 pill after supper, 2 pills at bedtime;Day 3:  1 pill at breakfast, 1 pill at lunch, 1 pill after supper, 1 pill at bedtime;Day 4:  1 pill at breakfast, 1 pill at lunch, 1 pill at bedtime;Day 5:  1 pill at breakfast, 1 pill at bedtime;Day 6:  1 pill at breakfast 11/23/23  Yes Dean Clarity, MD  dicyclomine  (BENTYL ) 20 MG tablet Take 1 tablet (20 mg total) by mouth 2 (two) times daily. 09/04/19   Babara Greig GAILS, PA-C  esomeprazole (NEXIUM) 40 MG capsule Take 40 mg by mouth daily before breakfast. Name brand only works    [provider]  naproxen  (NAPROSYN ) 500 MG tablet Take 1 pill twice a day if needed for upper back pain 04/15/22   Zammit, Joseph, MD    Allergies: Patient has no known allergies.    Review of Systems  Cardiovascular:  Positive for chest pain.  All other systems reviewed and are negative.   Updated Vital Signs BP 139/86 (BP Location: Right Arm)   Pulse 80   Temp 98 F (36.7 C) (Oral)   Resp 20    Ht 5' 10 (1.778 m)   Wt 75.3 kg   SpO2 99%   BMI 23.82 kg/m   Physical Exam Vitals and nursing note reviewed.  Constitutional:      Appearance: Normal appearance.  HENT:     Head: Normocephalic and atraumatic.     Right Ear: External ear normal.     Left Ear: External ear normal.     Nose: Nose normal.     Mouth/Throat:     Mouth: Mucous membranes are moist.     Pharynx: Oropharynx is clear.  Eyes:     Extraocular Movements: Extraocular movements intact.     Conjunctiva/sclera: Conjunctivae normal.     Pupils: Pupils are equal, round, and reactive to light.  Cardiovascular:     Rate and Rhythm: Normal rate and regular rhythm.     Pulses: Normal pulses.     Heart sounds: Normal heart sounds.  Pulmonary:     Effort: Pulmonary effort is normal.     Breath sounds: Normal breath sounds.  Chest:    Abdominal:     General: Abdomen is flat. Bowel sounds are normal.     Palpations: Abdomen is soft.  Musculoskeletal:  General: Normal range of motion.     Cervical back: Normal range of motion and neck supple.  Skin:    General: Skin is warm.     Capillary Refill: Capillary refill takes less than 2 seconds.  Neurological:     General: No focal deficit present.     Mental Status: He is alert and oriented to person, place, and time.  Psychiatric:        Mood and Affect: Mood normal.        Behavior: Behavior normal.     (all labs ordered are listed, but only abnormal results are displayed) Labs Reviewed  COMPREHENSIVE METABOLIC PANEL WITH GFR - Abnormal; Notable for the following components:      Result Value   Glucose, Bld 103 (*)    Total Protein 8.3 (*)    All other components within normal limits  CBC WITH DIFFERENTIAL/PLATELET  D-DIMER, QUANTITATIVE  LIPASE, BLOOD  TROPONIN T, HIGH SENSITIVITY  TROPONIN T, HIGH SENSITIVITY    EKG: EKG Interpretation Date/Time:  Sunday November 23 2023 10:13:24 EST Ventricular Rate:  99 PR Interval:  171 QRS  Duration:  85 QT Interval:  336 QTC Calculation: 432 R Axis:   96  Text Interpretation: Sinus rhythm Borderline right axis deviation Borderline T wave abnormalities ST elevation, likely early repol No significant change since last tracing Confirmed by Dean Clarity [phone]) on 11/23/2023 10:40:36 AM  Radiology: [facility] Chest 2 View Result Date: 11/23/2023 EXAM: 2 VIEW(S) XRAY OF THE CHEST 11/23/2023 10:22:00 AM COMPARISON: 04/15/2022 . CLINICAL HISTORY: chest wall pain FINDINGS: LUNGS AND PLEURA: No focal pulmonary opacity. No pulmonary edema. No pleural effusion. No pneumothorax. HEART AND MEDIASTINUM: No acute abnormality of the cardiac and mediastinal silhouettes. BONES AND SOFT TISSUES: No acute osseous abnormality. IMPRESSION: 1. No acute cardiopulmonary process identified. Electronically signed by: Waddell Calk MD 11/23/2023 10:39 AM EST RP Workstation: [id number]     Procedures   Medications Ordered in the ED  ketorolac (TORADOL) 30 MG/ML injection 30 mg (30 mg Intravenous Given 11/23/23 1032)  dexamethasone (DECADRON) injection 10 mg (10 mg Intravenous Given 11/23/23 1032)                                    Medical Decision Making Amount and/or Complexity of Data Reviewed Labs: ordered. Radiology: ordered.  Risk Prescription drug management.   This patient presents to the ED for concern of cp, this involves an extensive number of treatment options, and is a complaint that carries with it a high risk of complications and morbidity.  The differential diagnosis includes cardiac, pulm, gi, msk   Co morbidities that complicate the patient evaluation  GERD   Additional history obtained:  Additional history obtained from epic chart review External records from outside source obtained and reviewed including sig other   Lab Tests:  I Ordered, and personally interpreted labs.  The pertinent results include:  cbc nl, cmp nl, ddimer neg, lip nl   Imaging Studies ordered:  I  ordered imaging studies including cxr  I independently visualized and interpreted imaging which showed No acute cardiopulmonary process identified.  I agree with the radiologist interpretation   Cardiac Monitoring:  The patient was maintained on a cardiac monitor.  I personally viewed and interpreted the cardiac monitored which showed an underlying rhythm of: nsr   Medicines ordered and prescription drug management:  I ordered medication including toradol/decadron  for sx  Reevaluation of the patient after these medicines showed that the patient improved I have reviewed the patients home medicines and have made adjustments as needed   Test Considered:  ct   Problem List / ED Course:  Pleuritic cp:  cardiac eval neg.  Ddimer neg.  Pt is feeling much better.  He is stable for d/c.  Return if worse.     Reevaluation:  After the interventions noted above, I reevaluated the patient and found that they have :improved   Social Determinants of Health:  Lives at home   Dispostion:  After consideration of the diagnostic results and the patients response to treatment, I feel that the patent would benefit from discharge with outpatient f/u.       Final diagnoses:  Pleurisy    ED Discharge Orders          Ordered    methylPREDNISolone (MEDROL DOSEPAK) 4 MG TBPK tablet  Daily        11/23/23 1133    HYDROcodone-acetaminophen (NORCO/VICODIN) 5-325 MG tablet  Every 4 hours PRN       Note to Pharmacy: ICD Code:  R09.1   11/23/23 1133               Dean Clarity, MD 11/23/23 1134
